# Patient Record
Sex: Male | Born: 1935 | Race: White | Hispanic: No | Marital: Married
Health system: Southern US, Community
[De-identification: ages and names within clinical notes are randomized; demographics above are authoritative.]

---

## 2004-02-10 ENCOUNTER — Other Ambulatory Visit: Payer: Self-pay

## 2004-02-29 ENCOUNTER — Other Ambulatory Visit: Payer: Self-pay

## 2006-01-16 ENCOUNTER — Other Ambulatory Visit: Payer: Self-pay

## 2006-01-17 ENCOUNTER — Ambulatory Visit: Payer: Self-pay | Admitting: Ophthalmology

## 2006-02-21 ENCOUNTER — Ambulatory Visit: Payer: Self-pay | Admitting: Ophthalmology

## 2006-02-22 ENCOUNTER — Other Ambulatory Visit: Payer: Self-pay

## 2006-02-22 ENCOUNTER — Inpatient Hospital Stay: Payer: Self-pay | Admitting: Internal Medicine

## 2006-07-31 ENCOUNTER — Ambulatory Visit: Payer: Self-pay | Admitting: Family Medicine

## 2007-05-17 ENCOUNTER — Ambulatory Visit: Payer: Self-pay | Admitting: Internal Medicine

## 2007-05-22 ENCOUNTER — Other Ambulatory Visit: Payer: Self-pay

## 2007-05-22 ENCOUNTER — Inpatient Hospital Stay: Payer: Self-pay | Admitting: Internal Medicine

## 2007-05-23 ENCOUNTER — Other Ambulatory Visit: Payer: Self-pay

## 2007-10-11 ENCOUNTER — Inpatient Hospital Stay: Payer: Self-pay | Admitting: Cardiovascular Disease

## 2007-10-11 ENCOUNTER — Other Ambulatory Visit: Payer: Self-pay

## 2008-08-07 ENCOUNTER — Ambulatory Visit: Payer: Self-pay | Admitting: Family Medicine

## 2009-10-19 ENCOUNTER — Emergency Department: Payer: Self-pay | Admitting: Internal Medicine

## 2010-08-02 ENCOUNTER — Emergency Department: Payer: Self-pay | Admitting: Emergency Medicine

## 2010-10-14 ENCOUNTER — Observation Stay: Payer: Self-pay | Admitting: Internal Medicine

## 2011-03-02 ENCOUNTER — Emergency Department: Payer: Self-pay | Admitting: Emergency Medicine

## 2011-08-01 ENCOUNTER — Ambulatory Visit: Payer: Self-pay | Admitting: Gastroenterology

## 2012-05-25 ENCOUNTER — Emergency Department: Payer: Self-pay | Admitting: *Deleted

## 2012-05-25 LAB — CBC WITH DIFFERENTIAL/PLATELET
Basophil %: 1.3 %
Eosinophil #: 0.2 10*3/uL (ref 0.0–0.7)
Eosinophil %: 3.6 %
HCT: 27.1 % — ABNORMAL LOW (ref 40.0–52.0)
HGB: 8.8 g/dL — ABNORMAL LOW (ref 13.0–18.0)
Lymphocyte %: 27.6 %
MCH: 24.1 pg — ABNORMAL LOW (ref 26.0–34.0)
MCV: 74 fL — ABNORMAL LOW (ref 80–100)
Monocyte #: 0.5 x10 3/mm (ref 0.2–1.0)
Neutrophil #: 3.6 10*3/uL (ref 1.4–6.5)
Neutrophil %: 59.4 %
Platelet: 358 10*3/uL (ref 150–440)
RDW: 15 % — ABNORMAL HIGH (ref 11.5–14.5)

## 2012-05-25 LAB — BASIC METABOLIC PANEL
Anion Gap: 8 (ref 7–16)
BUN: 13 mg/dL (ref 7–18)
Chloride: 107 mmol/L (ref 98–107)
Co2: 26 mmol/L (ref 21–32)
EGFR (African American): 40 — ABNORMAL LOW
EGFR (Non-African Amer.): 34 — ABNORMAL LOW
Glucose: 102 mg/dL — ABNORMAL HIGH (ref 65–99)
Osmolality: 282 (ref 275–301)
Potassium: 4.2 mmol/L (ref 3.5–5.1)
Sodium: 141 mmol/L (ref 136–145)

## 2012-06-10 ENCOUNTER — Ambulatory Visit: Payer: Self-pay | Admitting: Gastroenterology

## 2012-10-03 ENCOUNTER — Emergency Department: Payer: Self-pay | Admitting: Emergency Medicine

## 2012-10-03 LAB — CBC
HCT: 28.7 % — ABNORMAL LOW (ref 40.0–52.0)
MCH: 20.4 pg — ABNORMAL LOW (ref 26.0–34.0)
MCHC: 31.2 g/dL — ABNORMAL LOW (ref 32.0–36.0)
MCV: 65 fL — ABNORMAL LOW (ref 80–100)
Platelet: 413 10*3/uL (ref 150–440)
RDW: 16.6 % — ABNORMAL HIGH (ref 11.5–14.5)
WBC: 8.4 10*3/uL (ref 3.8–10.6)

## 2012-10-03 LAB — URINALYSIS, COMPLETE
Blood: NEGATIVE
Glucose,UR: NEGATIVE mg/dL (ref 0–75)
Hyaline Cast: 6
Nitrite: NEGATIVE
Ph: 5 (ref 4.5–8.0)
Protein: NEGATIVE
RBC,UR: 6 /HPF (ref 0–5)
WBC UR: 10 /HPF (ref 0–5)

## 2012-10-03 LAB — COMPREHENSIVE METABOLIC PANEL
Albumin: 4 g/dL (ref 3.4–5.0)
Alkaline Phosphatase: 137 U/L — ABNORMAL HIGH (ref 50–136)
Calcium, Total: 9.2 mg/dL (ref 8.5–10.1)
Co2: 26 mmol/L (ref 21–32)
EGFR (Non-African Amer.): 33 — ABNORMAL LOW
Glucose: 110 mg/dL — ABNORMAL HIGH (ref 65–99)
SGOT(AST): 25 U/L (ref 15–37)
SGPT (ALT): 22 U/L (ref 12–78)
Sodium: 145 mmol/L (ref 136–145)

## 2012-10-03 LAB — PROTIME-INR
INR: 0.9
Prothrombin Time: 12.8 secs (ref 11.5–14.7)

## 2012-10-03 LAB — TROPONIN I: Troponin-I: <0.02 ng/mL

## 2013-08-04 ENCOUNTER — Ambulatory Visit: Payer: Self-pay | Admitting: Internal Medicine

## 2013-08-13 ENCOUNTER — Inpatient Hospital Stay: Payer: Self-pay | Admitting: Family Medicine

## 2013-08-13 LAB — RETICULOCYTES
Absolute Retic Count: 0.0589 10*6/uL (ref 0.019–0.186)
Reticulocyte: 1.7 % (ref 0.4–3.1)

## 2013-08-13 LAB — URINALYSIS, COMPLETE
Bacteria: NONE SEEN
Blood: NEGATIVE
Glucose,UR: NEGATIVE mg/dL (ref 0–75)
Nitrite: NEGATIVE
RBC,UR: <1 /HPF (ref 0–5)
Specific Gravity: 1.012 (ref 1.003–1.030)
Squamous Epithelial: 1
WBC UR: 4 /HPF (ref 0–5)

## 2013-08-13 LAB — CBC WITH DIFFERENTIAL/PLATELET
Basophil %: 0.9 %
Eosinophil %: 3.3 %
HCT: 20.7 % — ABNORMAL LOW (ref 40.0–52.0)
Lymphocyte #: 1.4 10*3/uL (ref 1.0–3.6)
Lymphocyte %: 21 %
MCH: 18.1 pg — ABNORMAL LOW (ref 26.0–34.0)
MCHC: 30.2 g/dL — ABNORMAL LOW (ref 32.0–36.0)
Monocyte #: 0.6 x10 3/mm (ref 0.2–1.0)
Monocyte %: 8.9 %
Neutrophil #: 4.4 10*3/uL (ref 1.4–6.5)
Neutrophil %: 65.9 %
RBC: 3.46 10*6/uL — ABNORMAL LOW (ref 4.40–5.90)
RDW: 17.6 % — ABNORMAL HIGH (ref 11.5–14.5)

## 2013-08-13 LAB — COMPREHENSIVE METABOLIC PANEL
Albumin: 3.7 g/dL (ref 3.4–5.0)
Alkaline Phosphatase: 120 U/L (ref 50–136)
Anion Gap: 7 (ref 7–16)
BUN: 24 mg/dL — ABNORMAL HIGH (ref 7–18)
Chloride: 108 mmol/L — ABNORMAL HIGH (ref 98–107)
Co2: 23 mmol/L (ref 21–32)
Creatinine: 2.35 mg/dL — ABNORMAL HIGH (ref 0.60–1.30)
Glucose: 109 mg/dL — ABNORMAL HIGH (ref 65–99)
Potassium: 4.5 mmol/L (ref 3.5–5.1)
SGOT(AST): 31 U/L (ref 15–37)
SGPT (ALT): 29 U/L (ref 12–78)
Total Protein: 6.8 g/dL (ref 6.4–8.2)

## 2013-08-13 LAB — PROTIME-INR: INR: 1.1

## 2013-08-13 LAB — IRON AND TIBC
Iron Bind.Cap.(Total): 447 ug/dL (ref 250–450)
Iron: 15 ug/dL — ABNORMAL LOW (ref 65–175)
Unbound Iron-Bind.Cap.: 432 ug/dL

## 2013-08-13 LAB — TROPONIN I: Troponin-I: 0.02 ng/mL

## 2013-08-13 LAB — FERRITIN: Ferritin (ARMC): 4 ng/mL — ABNORMAL LOW (ref 8–388)

## 2013-08-14 LAB — CBC WITH DIFFERENTIAL/PLATELET
Basophil #: 0.1 10*3/uL (ref 0.0–0.1)
Eosinophil %: 1.1 %
HCT: 26.3 % — ABNORMAL LOW (ref 40.0–52.0)
HGB: 8.2 g/dL — ABNORMAL LOW (ref 13.0–18.0)
Lymphocyte #: 1.7 10*3/uL (ref 1.0–3.6)
Monocyte %: 8.5 %
Neutrophil #: 8.4 10*3/uL — ABNORMAL HIGH (ref 1.4–6.5)
Platelet: 356 10*3/uL (ref 150–440)
RBC: 4.09 10*6/uL — ABNORMAL LOW (ref 4.40–5.90)
RDW: 23 % — ABNORMAL HIGH (ref 11.5–14.5)
WBC: 11.3 10*3/uL — ABNORMAL HIGH (ref 3.8–10.6)

## 2013-08-14 LAB — MAGNESIUM: Magnesium: 2.1 mg/dL

## 2013-08-14 LAB — BASIC METABOLIC PANEL
Anion Gap: 9 (ref 7–16)
BUN: 19 mg/dL — ABNORMAL HIGH (ref 7–18)
Calcium, Total: 9 mg/dL (ref 8.5–10.1)
Co2: 22 mmol/L (ref 21–32)
Creatinine: 1.9 mg/dL — ABNORMAL HIGH (ref 0.60–1.30)
EGFR (Non-African Amer.): 33 — ABNORMAL LOW
Osmolality: 282 (ref 275–301)

## 2013-08-14 LAB — HEMOGLOBIN: HGB: 8.1 g/dL — ABNORMAL LOW (ref 13.0–18.0)

## 2013-08-15 LAB — CBC WITH DIFFERENTIAL/PLATELET
Basophil #: 0.1 10*3/uL (ref 0.0–0.1)
Basophil %: 0.9 %
Eosinophil #: 0.2 10*3/uL (ref 0.0–0.7)
Eosinophil %: 2 %
HCT: 25.2 % — ABNORMAL LOW (ref 40.0–52.0)
Lymphocyte #: 1.1 10*3/uL (ref 1.0–3.6)
Lymphocyte %: 12.2 %
MCH: 20.3 pg — ABNORMAL LOW (ref 26.0–34.0)
MCV: 63 fL — ABNORMAL LOW (ref 80–100)
Monocyte #: 0.7 x10 3/mm (ref 0.2–1.0)
Monocyte %: 7.5 %
Neutrophil %: 77.4 %
RDW: 22.8 % — ABNORMAL HIGH (ref 11.5–14.5)

## 2013-08-15 LAB — OCCULT BLOOD X 1 CARD TO LAB, STOOL: Occult Blood, Feces: NEGATIVE

## 2013-08-15 LAB — BASIC METABOLIC PANEL
Anion Gap: 7 (ref 7–16)
BUN: 15 mg/dL (ref 7–18)
Calcium, Total: 8.8 mg/dL (ref 8.5–10.1)
Co2: 22 mmol/L (ref 21–32)
Creatinine: 1.43 mg/dL — ABNORMAL HIGH (ref 0.60–1.30)
EGFR (Non-African Amer.): 46 — ABNORMAL LOW
Glucose: 93 mg/dL (ref 65–99)
Osmolality: 286 (ref 275–301)
Sodium: 143 mmol/L (ref 136–145)

## 2013-08-15 LAB — CLOSTRIDIUM DIFFICILE BY PCR

## 2013-08-16 LAB — CBC WITH DIFFERENTIAL/PLATELET
Basophil #: 0.1 10*3/uL (ref 0.0–0.1)
Eosinophil #: 0.5 10*3/uL (ref 0.0–0.7)
HCT: 25.8 % — ABNORMAL LOW (ref 40.0–52.0)
Lymphocyte #: 1.3 10*3/uL (ref 1.0–3.6)
MCHC: 31.3 g/dL — ABNORMAL LOW (ref 32.0–36.0)
MCV: 65 fL — ABNORMAL LOW (ref 80–100)
Monocyte #: 0.7 x10 3/mm (ref 0.2–1.0)
Monocyte %: 8.7 %
Neutrophil #: 5 10*3/uL (ref 1.4–6.5)
Neutrophil %: 66.3 %
Platelet: 351 10*3/uL (ref 150–440)
RDW: 23 % — ABNORMAL HIGH (ref 11.5–14.5)

## 2013-08-16 LAB — BASIC METABOLIC PANEL
EGFR (Non-African Amer.): 42 — ABNORMAL LOW
Glucose: 88 mg/dL (ref 65–99)
Sodium: 145 mmol/L (ref 136–145)

## 2013-08-16 LAB — WBCS, STOOL

## 2013-08-19 LAB — CBC WITH DIFFERENTIAL/PLATELET
Basophil #: 0.1 x10 3/mm 3
Basophil %: 1.5 %
Eosinophil #: 0.3 x10 3/mm 3
Eosinophil %: 6.5 %
HCT: 26.1 % — ABNORMAL LOW
HGB: 8.2 g/dL — ABNORMAL LOW
Lymphocyte %: 25.7 %
Lymphs Abs: 1.3 x10 3/mm 3
MCH: 20.2 pg — ABNORMAL LOW
MCHC: 31.6 g/dL — ABNORMAL LOW
MCV: 64 fL — ABNORMAL LOW
Monocyte #: 0.5 "x10 3/mm "
Monocyte %: 9.7 %
Neutrophil #: 2.8 x10 3/mm 3
Neutrophil %: 56.6 %
Platelet: 293 x10 3/mm 3
RBC: 4.08 x10 6/mm 3 — ABNORMAL LOW
RDW: 25.4 % — ABNORMAL HIGH
WBC: 4.9 x10 3/mm 3

## 2013-08-19 LAB — BASIC METABOLIC PANEL WITH GFR
Anion Gap: 7
BUN: 11 mg/dL
Calcium, Total: 8.7 mg/dL
Chloride: 114 mmol/L — ABNORMAL HIGH
Co2: 25 mmol/L
Creatinine: 1.31 mg/dL — ABNORMAL HIGH
EGFR (African American): 60 — ABNORMAL LOW
EGFR (Non-African Amer.): 51 — ABNORMAL LOW
Glucose: 89 mg/dL
Osmolality: 289
Potassium: 3.6 mmol/L
Sodium: 146 mmol/L — ABNORMAL HIGH

## 2013-09-03 ENCOUNTER — Ambulatory Visit: Payer: Self-pay | Admitting: Nurse Practitioner

## 2014-08-01 IMAGING — RF DG BARIUM SWALLOW
5 series · 14 of 20 positions shown · non-contrast
Comparison: none

REASON FOR EXAM: dysphagia
COMMENTS:

[Series 1: fluoro_barium 2fps_bw · 0.17mm/px · 3 of 28 frames shown (1 of 5)]
[frame 5/28]
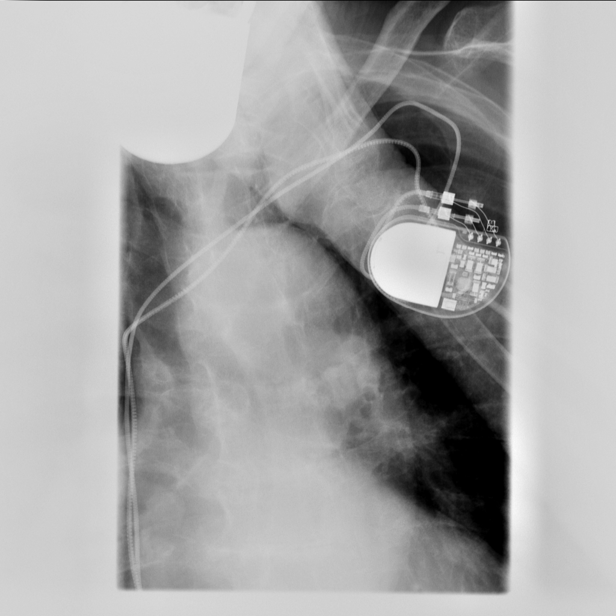
[frame 23/28]
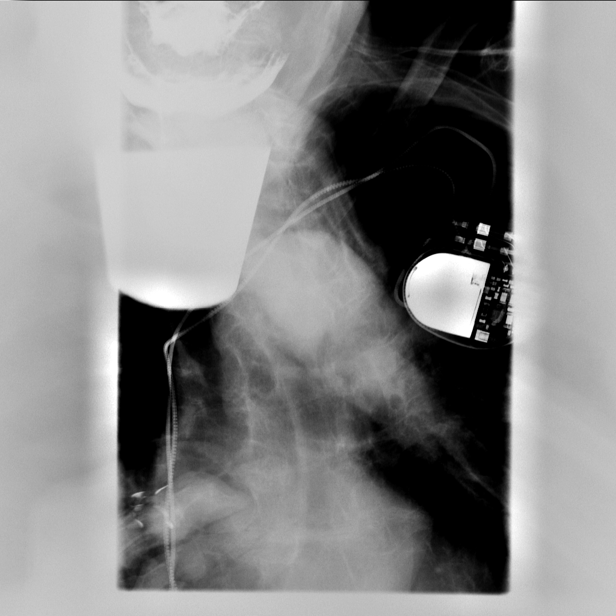
[frame 24/28]
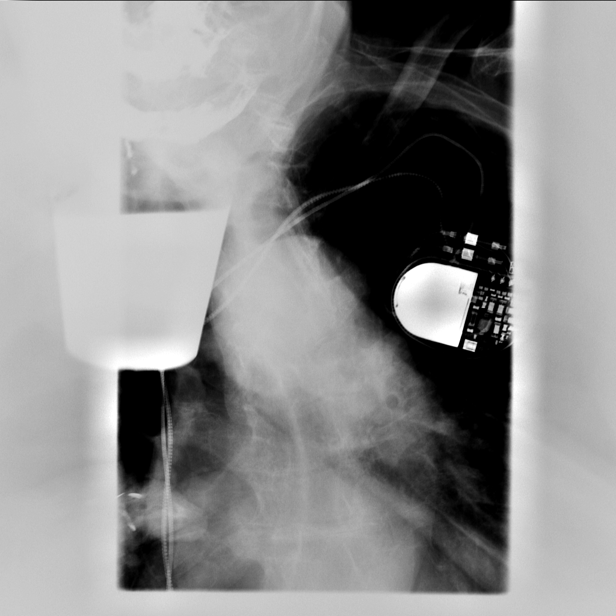

[Series 2: fluoro_barium 2fps_bw · 0.17mm/px · 3 of 25 frames shown (2 of 5)]
[frame 13/25]
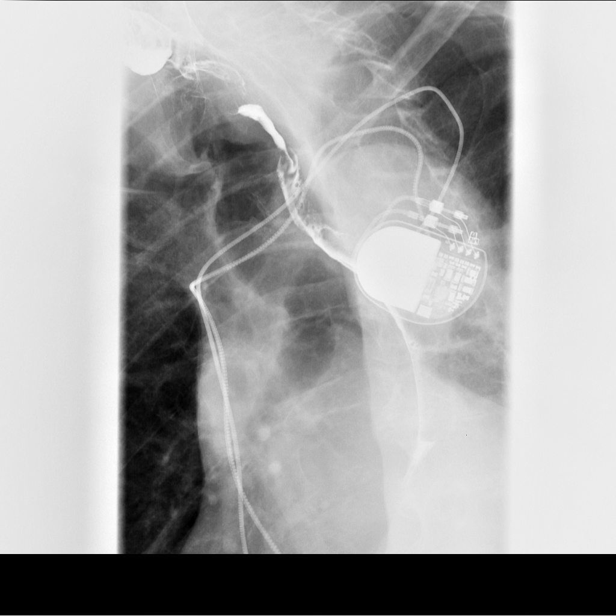
[frame 22/25]
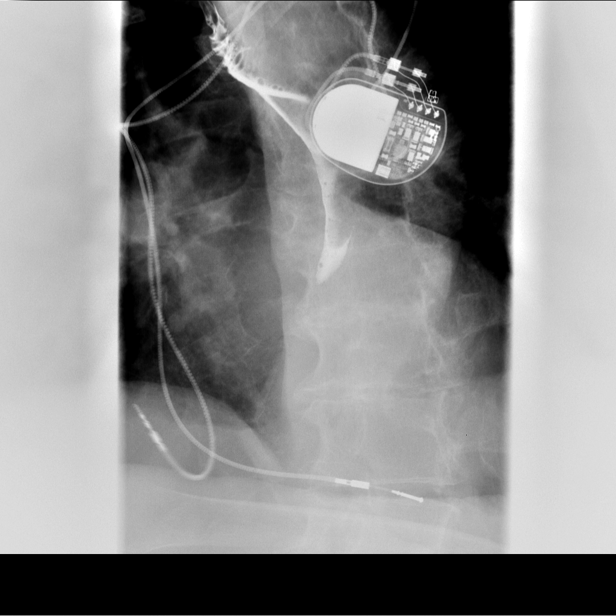
[frame 25/25]
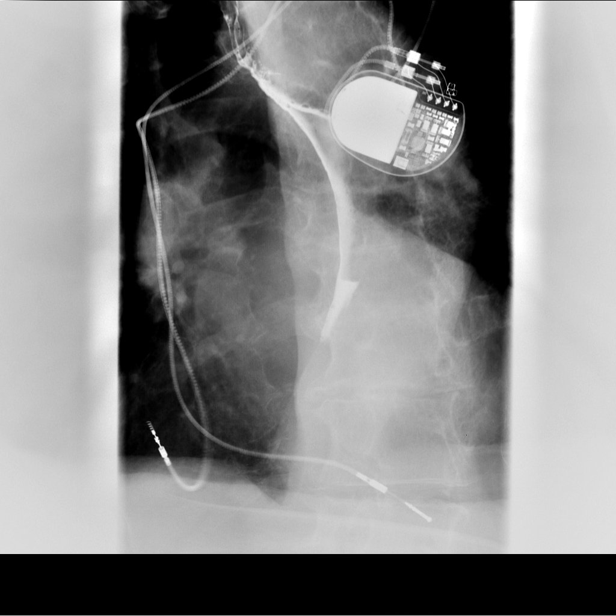

[Series 3: fluoro_barium 2fps_bw · 0.17mm/px · 2 of 30 frames shown (3 of 5)]
[frame 14/30]
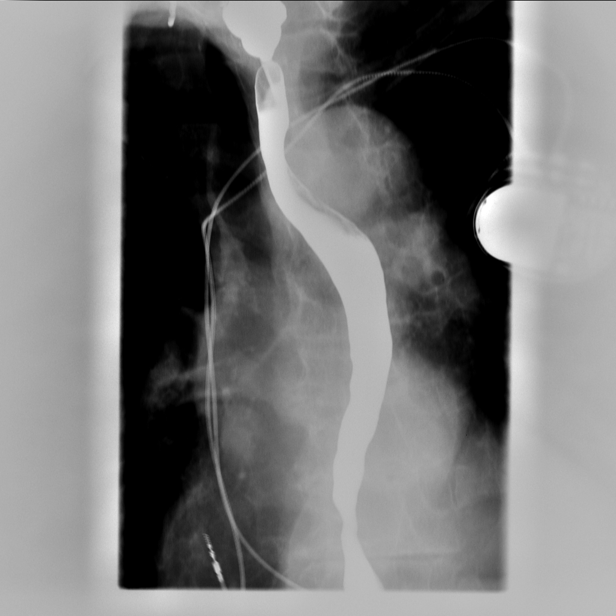
[frame 16/30]
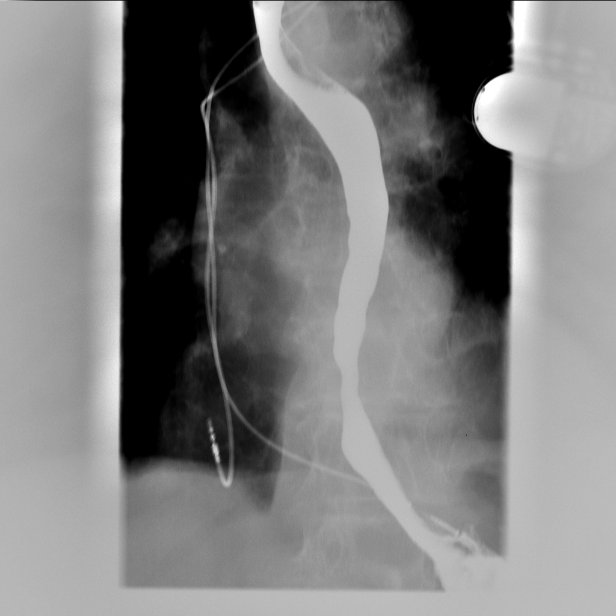

[Series 4: fluoro_barium 2fps_bw · 0.17mm/px · 3 of 33 frames shown (4 of 5)]
[frame 5/33]
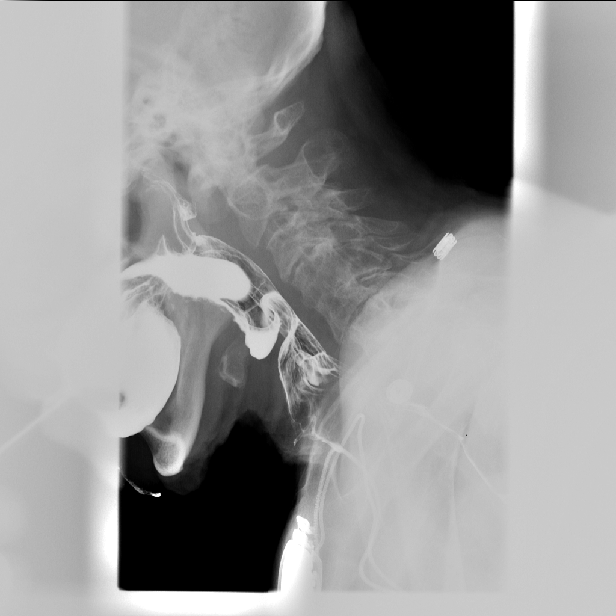
[frame 17/33]
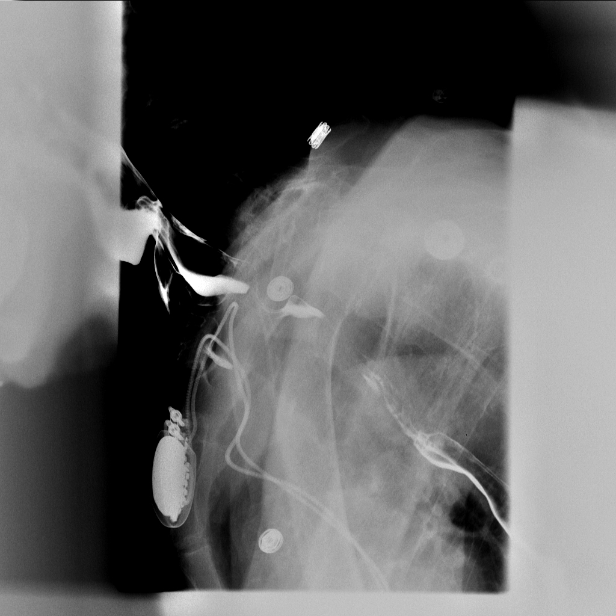
[frame 29/33]
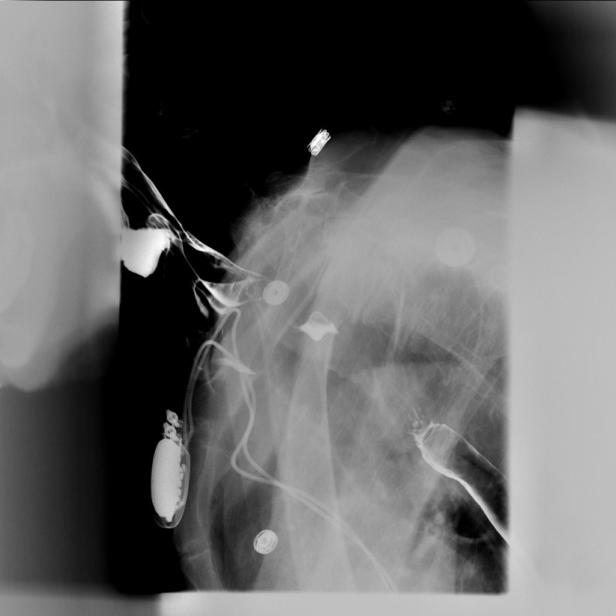

[Series 5: fluoro_barium 2fps_bw · 0.17mm/px · 3 of 5 frames shown (5 of 5)]
[frame 1/5]
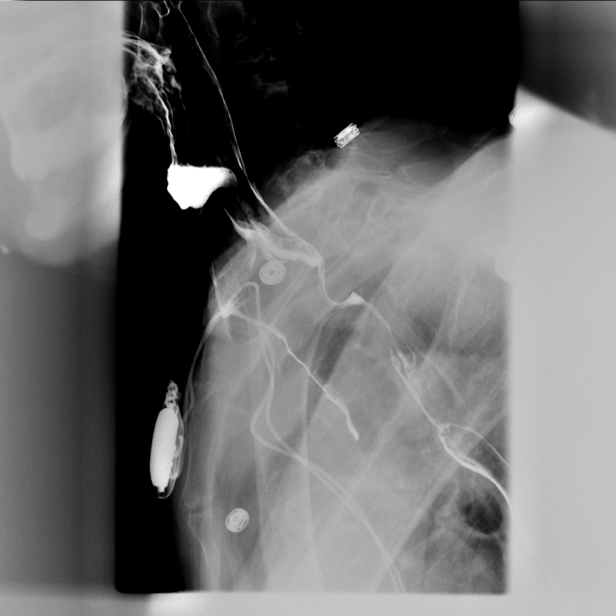
[frame 2/5]
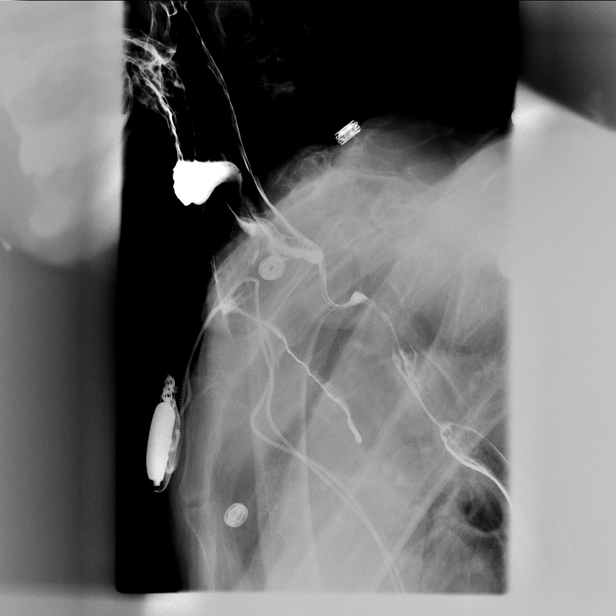
[frame 5/5]
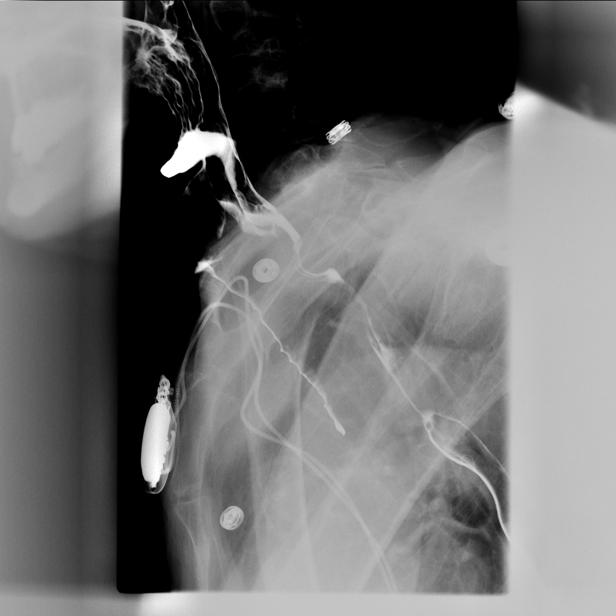

[14 of 20 positions shown; findings below may reference images not displayed]

PROCEDURE:     FL  - FL BARIUM SWALLOW  - August 18, 2013  [DATE]

RESULT:     Limited single contrast barium swallow exam was attempted. The
patient had difficulty standing and drinking. The initial swallow with
small. A subsequent swallow demonstrated are a greater volume with some
persistent narrowing in the upper esophagus. A lateral swallow was
attempted. The patient aspirated freely but didn't have a spontaneous cough.
There is retention of barium in the vallecula. There some mild narrowing
anterior to posterior in the upper esophagus on the lateral view.
IMPRESSION: 1. Incomplete limited study based on spontaneous aspiration. Modified
swallow exam is recommended.
2. There appears to be some persistent narrowing in the upper esophagus on
the oblique frontal view with mild narrowing anterior to posterior on the
lateral view. Given the incomplete nature the study this is of uncertain
significance. Endoscopic evaluation could be considered.

[REDACTED]

## 2014-10-04 DEATH — deceased

## 2015-03-26 NOTE — Consult Note (Signed)
PATIENT NAME:  Derrick Salazar, Derrick Salazar MR#:  [id number] DATE OF BIRTH:  January 03, 1931  DATE OF CONSULTATION:  08/14/2013  CONSULTING PHYSICIAN:  Ezzard StandingPaul Y. Antony Sian, MD  REASON FOR REFERRAL: Iron deficiency anemia.   HISTORY OF PRESENT ILLNESS: The patient is a 81 year old white male who was brought in from his primary doctor's office due to hemoglobin being so low. He has a known history of iron deficiency anemia. In fact I did a colonoscopy and upper endoscopy in August 2012 for the same reason. At that time, upper endoscopy and colonoscopy were completely normal. I had recommended that he have a video capsule study done.   The patient did follow up in our office in 2013 and capsule was scheduled; however, that was canceled afterwards and the patient never followed up until he was brought here.   In the Emergency Room, his stool was negative for blood. His hemoglobin was 6.2 on admission. He was given some blood transfusions. This morning it is 8.2. However, I was not able to get any history from the patient. He was pretty sleepy after the nurses gave him a bath. He would not wake up and would not provide any history at all to me.   REVIEW OF SYSTEMS: Can not be obtained.   ALLERGIES: No known drug allergy.   PAST MEDICAL HISTORY: Apparently he has a history of hypertension and coronary artery disease and had a pacemaker placed and history of reflux.   MEDICATIONS AT HOME: Include omeprazole and Lasix.   FAMILY HISTORY: Hypertension, heart disease.   PHYSICAL EXAMINATION:  GENERAL: Again, the patient is in no acute distress, afebrile. Again, the patient is unable to provide any history.  VITAL SIGNS: Stable.  CARDIAC: Regular rate and rhythm without murmurs.  LUNGS: Clear bilaterally.  ABDOMEN: Normoactive bowel sounds. Soft, nontender. There is no hepatomegaly.  EXTREMITIES: No clubbing, cyanosis, or edema.   LABORATORY, DIAGNOSTIC, AND RADIOLOGICAL DATA: Today after blood transfusion, it is 8.2 but  the creatinine is 1.9. The rest of the liver enzymes are normal. White count 11.3.   ASSESSMENT AND PLAN: This is a patient with a known history of iron deficiency anemia. His ferritin level was only 4 but there are no signs of active bleeding.   He does have a history of iron deficiency anemia; however, it is unclear whether he is having internal gastrointestinal bleeding or not. The patient is not alert enough to undergo any procedures at this time. Even if he were to repeat it, I not sure whether we would find anything abnormal either on endoscopy or colonoscopy.   I will not plan on doing anything unless there is power of attorney or a family member who insists that further gastrointestinal work-up be done.   The other alternative is to consider doing a video capsule as an outpatient.   Thank you for your referral.     ____________________________ Ezzard StandingPaul Y. Bluford Kaufmannh, MD pyo:np D: 08/14/2013 15:16:28 ET T: 08/14/2013 15:49:07 ET JOB#: [id number]  cc: Ezzard StandingPaul Y. Bluford Kaufmannh, MD, <Dictator> Wallace CullensPAUL Y Ewald Beg MD ELECTRONICALLY SIGNED 08/15/2013 8:43

## 2015-03-26 NOTE — Consult Note (Signed)
Pt seen and examined. Full consult to follow. Pt with dementia. Just had a bath. Sleeping and not waking up to talk to me. Hx of Fe def anemia. Had normal EGD/colon by me in 07/2011. Recommended to have video capsule study then, but patient cancelled and never followed up. Stool neg for blood. Ferritin level quite low. Got blood transfusion. Not sure if we were repeat to EGD/colon, whether we will find anything abnormal. Anyway, patient cannot give consent to me now. Must have family or POA insist that we proceed before I will schedule endoscopy for patient. Thanks  Electronic Signatures: Lutricia Feilh, Latandra Loureiro (MD)  (Signed on 11-Sep-14 08:34)  Authored  Last Updated: 11-Sep-14 08:34 by Lutricia Feilh, Cory Rama (MD)

## 2015-03-26 NOTE — Consult Note (Signed)
Brief Consult Note: Diagnosis: Mild cognitive disorder, delirium secondary to medical condition/medications resolved.   Patient was seen by consultant.   Consult note dictated.   Recommend further assessment or treatment.   Discussed with Attending MD.   Comments: [name] has no psychiatric past. he was evaluated by Dr. Guss Bundehalla for capacity to decide about disposition. He was somewhat confused then. I evaluated the patient last night. He has good understanding of his situation and can discuss pros and cons of returning to home. Case discussed with care manager.  PLAN: 1. The patient does have the capacity to decide about discharge.  2. No medications recommended..  Electronic Signatures: Kristine LineaPucilowska, Derrick Salazar (MD)  (Signed 16-Sep-14 22:57)  Authored: Brief Consult Note   Last Updated: 16-Sep-14 22:57 by Kristine LineaPucilowska, Dawnisha Marquina (MD)

## 2015-03-26 NOTE — Consult Note (Signed)
PATIENT NAME:  Derrick Salazar, [name] [id number] OF BIRTH:  12-04-1931 OF ADMISSION:  09/10/2014OF CONSULTATION:  08/18/2013 PHYSICIAN: Krystal EatonShayiq Ahmadzia, MDPHYSICIAN: Kristine LineaJolanta Zarin Knupp, MDCARE PHYSICIAN: Steele SizerMark A. Crissman, MD FOR CONSULTATION: Evaluation of capacity to decide about disposition.  DATA: [name] is a 81 year old male with no psychiatric past.  COMPLAINT: "You need to ask Dr. Dossie Arbourrissman."   OF PRESENT ILLNESS: [name] has no history of mental illness except for mild cognitive decline as documented by Dr. Guss Bundehalla in her consultation oh 08/16/2013. The patient had less than ideal recall and was slightly confused. At the time of my assessment the patient was alert and fully oriented, easy to interview, thoughtful and funny. He denies any symptoms of depression, anxiety or psychosis. There is no alcohol, illicit substance or prescription pill abuse. The patient realizes that his memory, energy and stamina are not the same as they used to be but he enjoys the solitude of his house. He has an aid who visits him twice a week and a friend who also helps but is out of town now. He would like to go home with home health. After blood transfusion, he feels much better, ready for discharge. He is not interested in discharge to SNF and considers it a waste of money. He is philosophical about his age, realizes that he old and will have to "go". He feels fully prepared to meet his maker but is in no rush. PSYCHIATRIC HISTORY: None. PSYCHIATRIC HISTORY: None.  MEDICAL HISTORY: Hypertension, hyperlipidemia, CAD, chronic A. fib, history of heart block, status post pacemaker, history of colon polyps, osteoarthritis and GERD.   ALLERGIES: No known drug allergies.  ON ADMISSION: Omeprazole 20 mg daily, tamsulosin 0.4 mg daily and furosemide 20 mg daily.   HISTORY: As above lives by himself, no family.  OF SYSTEMS: The patient denies any pain or discomfort.   EXAMINATION:VITAL SIGNS: Blood pressure 140/67, pulse 68,  respirations 18, temperature 98.4. This is an elderly thin gentleman in no acute distress. The rest of the physical examination is deferred to his primary attending. DATA: Chemistries are within normal limits except Cr 1.56. CBC indicates severe anemia with Hg 8.1 after blood transfusion.  STATUS EXAMINATION: The patient is alert and oriented to person, place, and situation. He is pleasant, polite and cooperative. He is seating on the edge of his bed chatting with a nurse.  He is pleasant, polite and cooperative. She is wearing a hospital gown. He maintains good eye contact. His speech is soft. He almost whispers. Mood is "good" with full affect. Thought process is logical. There are no safety issues or psychosis. His cognition is grossly intact. His insight and judgment are good.   I:  Cognitive disorder NOS.    Rule out delirium secondary to medical condition now resolved. II:  Deferred. III:  Hypertension, dyslipidemia, CAD, chronic A. fib, history of heart block, status post pacemaker, history of colon polyps, osteoarthritis, GERD.  AXIS IV:  New onset physical illness, primary support, cognitive decline, loss of way of life.  V:  Global Assessment of Functioning 50.   1. The patient does have the capacity to decide about discharge. 2. No medications recommended but we could consider Remeron if sleep and lack of appetite are of concern.    Electronic Signatures: Kristine LineaPucilowska, Byrl Latin (MD)  (Signed on 16-Sep-14 22:59)  Authored  Last Updated: 16-Sep-14 22:59 by Kristine LineaPucilowska, Zia Najera (MD)

## 2015-03-26 NOTE — Consult Note (Signed)
PATIENT NAME:  Derrick Salazar, Derrick Salazar MR#:  [id number] DATE OF BIRTH:  02/12/1931  DATE OF CONSULTATION:  08/16/2013  [id number]  REFERRING PHYSICIAN:   CONSULTING PHYSICIAN:  Rosalynn Sergent K. Court Gracia, MD  AGE:  81 years.  SEX:  Male.  RACE:  White.  The patient was seen in consultation in room# 225.   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 The patient widowed for 14 years after being married for many years, has been living by himself, and he has a Home Health care that helps him. The patient was brought to the hospital and was asked to be seen for decision-making capacity.   OBJECTIVE:  The patient was being seen lying in bed and is being cared by 2 CNAs who get him dressed. He is alert and he said he was in a hospital in [place] but does not know the name of the hospital. He is hard of hearing and so questions had to be repeated. He denies feeling depressed. He could count money. He said 4 quarters, 10 dimes, but he did not know the number of nickels and a hundred pennies in a dollar. Denies hearing voice saying things, denies feeling hopeless or helpless. He reports that he likes to live by himself and he did not want to go to facility and when mentioned immediately he said "no."  Regarding judgment for fire, he said he will call the people and run away and clap his hands. Memory and recall are poor. He could not remember any of the 3 objects that he was asked to remember after a few minutes and several minutes and he got frustrated that he could not recall any of the 3 objects. Insight and judgment are guarded.  IMPRESSION:  Dementia, mild, versus a mild cognitive impairment with loss of recall and poor decision making capacity.   IMPRESSION:  The patient has dementia, which is late onset, and with memory and recall that are poor and could not recall any of the 3 objects that he was asked to remember after a few minutes and several minutes which places him in difficulty in  taking decisions and caring for himself. He does need help and supervision in taking care of himself and he may need a power of attorney or a guardian as recommended by the court system.  ____________________________ Jannet MantisSurya K. Guss Bundehalla, MD skc:jm D: 08/16/2013 16:52:00 ET T: 08/16/2013 17:02:56 ET JOB#: [id number]  cc: Monika SalkSurya K. Guss Bundehalla, MD, <Dictator> Beau FannySURYA K Khristy Kalan MD ELECTRONICALLY SIGNED 08/17/2013 15:50

## 2015-03-26 NOTE — Consult Note (Signed)
   Comments   I spoke with pt's friend/primary contact, Derrick Salazar. Mr Derrick Salazar confirms that pt has no living relatives. Mr Derrick Salazar has helped care for pt for the past several years. Pt lives alone but has a CNA two days a week and an Charity fundraiserN who checks on him as well, both through [employer]. Mr Derrick Salazar does not think that pt has dementia. told Mr Derrick Salazar of my conversation with pt yesterday about code status. He says that he has had discussions about end of life with pt and  feels that pt would want to be a DNR. As I felt pt was lucid yesterday when I spoke with him, I will change pt to DNR.  my conversation with Mr Derrick Salazar, I spoke with pt's CNA Derrick Salazar(Shirley # [phone]) who was at pt's bedside. She says that pt stays in bed alot but that he always knows her and does not feel that pt has dementia.  change in mental status is delerium though unclear etiology. Agree with prn haldol as ordered by Dr Nemiah CommanderKalisetti.  #'s for Derrick BackboneCasey Salazar: [phone] or [phone]  Electronic Signatures: Derrick Salazar, Derrick SineNancy (MD)  (Signed 11-Sep-14 14:55)  Authored: Palliative Care   Last Updated: 11-Sep-14 14:55 by Tryone Kille, Derrick SineNancy (MD)

## 2015-03-26 NOTE — Consult Note (Signed)
Chief Complaint:  Subjective/Chief Complaint history of dysphagia   Assessment/Plan:  Assessment/Plan:  Plan Discussed case with Dr Bluford Kaufmannh and Dr Shelle Ironein. In a 81 year old with multiple comorbidities, who is not currently complaining of dysphagia, and EGD would not be recommended at this time. He has a history of sore throat complaints with dysphagia, but he states he has not had any trouble swallowing for several days now. No trouble eating, no trouble with his appetite. Bariu mswallow reviewed and non-diagnostic due to imcomplete exam. He is not complaining of dysphagia currently, and therefore we will hold off on endoscopic intervention at this time.   Electronic Signatures: Ashok CordiaEarle, Gissell Barra M (PA-C)  (Signed 17-Sep-14 08:54)  Authored: Chief Complaint, Assessment/Plan   Last Updated: 17-Sep-14 08:54 by Ashok CordiaEarle, Tiffany Calmes M (PA-C)

## 2015-03-26 NOTE — Consult Note (Signed)
Chief Complaint:  Subjective/Chief Complaint abrnomal barium swallow, hx of dysphagia  HPI: Patient is sitting up in chair at bedside. He states his back hurts due to positioning, and he is feeling "weak". When asked about eating habits, he states he has been eating without difficulty, but admits he has not been eating as much as he usually does. He denies dysphagia, denies trouble swallowing or difficulty eating. Barium swallow reviewed, non-diagnostic. The patient states he "used to" have trouble swallowing, but that has resolved and he has not had any trouble "for several days". Was recently treated with antifungals and there is question whether or not that was what helped improve his symptoms. No other current complaints.   Brief Assessment:  GEN well developed, well nourished, no acute distress, thin, sitting in chair at bedside   Cardiac Irregular   Respiratory normal resp effort  clear BS   Gastrointestinal Normal   Gastrointestinal details normal Soft  Nontender  Nondistended  No masses palpable  No rebound tenderness  No gaurding  No rigidity   Additional Physical Exam Patient is alert and speaking to me, appears to be sharp enough to answer my questions.   Radiology Results: XRay:    15-Sep-14 08:39, Barium Swallow Diagnostic  Barium Swallow Diagnostic   REASON FOR EXAM:    dysphagia  COMMENTS:       PROCEDURE: FL  - FL BARIUM SWALLOW  - Aug 18 2013  8:39AM     RESULT: Limited single contrast barium swallow exam was attempted. The   patient had difficulty standing and drinking. The initial swallow with   small. A subsequent swallow demonstrated are a greater volume with some   persistent narrowing in the upper esophagus. A lateral swallow was   attempted. The patient aspirated freely but didn't have a spontaneous   cough. There is retention of barium in the vallecula. There some mild   narrowing anterior to posterior in the upper esophagus on the lateral    view.    IMPRESSION:   1. Incomplete limited study based on spontaneous aspiration. Modified   swallow exam is recommended.  2. There appears to be some persistent narrowing in the upper esophagus   on the oblique frontal view with mild narrowing anterior to posterior on   the lateral view. Given the incomplete nature the study this is of   uncertain significance. Endoscopic evaluation could be considered.    Dictation Site: 1        Verified By: Elveria RoyalsGEOFFREY H. BROWNE, M.D., MD   Assessment/Plan:  Assessment/Plan:  Assessment dysphagia- history of. patient is denying dysphagia/trouble swallowing when he was asked. He says he "used to" have trouble, but has not had any difficulty recently, including the past several days.  Abnormal BS- non-diagnostic. possible esophageal stricture, but unclear.   Plan Reviewed case with Dr Bluford Kaufmannh and Owens Sharkawn Harrison, who consulted the patient last week 08/14/13 from GI. Patient is currently denying dysphagia or trouble with eating/swallowing. Barium swallow non-diagnostic. Patient has multiple comorbidities, and was recently critically ill. EGD may have risks outweighing the benefits in a patient who is denying dysphagia. He was recently treated with antifungals, and per the history and per the patient, he has not had any dysphasia since that treatment. ?possible underlying candida esophagitis.Overal clinical picture is unclear. I will discuss the case with Dr Shelle Ironein and Dr Bluford Kaufmannh, and make further recommendations from there.  Will continue to follow the patient. continue current diet per speech path recommendations for now.  Electronic Signatures: Ashok Cordia (PA-C)  (Signed 16-Sep-14 16:41)  Authored: Chief Complaint, Brief Assessment, Radiology Results, Assessment/Plan   Last Updated: 16-Sep-14 16:41 by Ashok Cordia (PA-C)

## 2015-03-26 NOTE — Discharge Summary (Signed)
PATIENT NAME:  Derrick Salazar, Derrick Salazar MR#:  [id number] DATE OF BIRTH:  03/27/1931  DATE OF ADMISSION:  08/13/2013 DATE OF DISCHARGE:  08/20/2013  ADDENDUM Please refer to interim discharge summary dictated by Dr. Nemiah CommanderKalisetti on 08/19/2013.  DIAGNOSES: Acute on chronic anemia, requiring 1 unit of packed red blood cells, hypertension, coronary artery disease, hyperlipidemia, chronic atrial fibrillation, sick sinus rhythm, colonic polyps, osteoarthritis, dysphagia, gastroesophageal reflux disease, delirium.   BRIEF HISTORY:  The patient is a 81 year old gentleman with history of dementia, chronic atrial fibrillation, sick sinus syndrome, status post pacemaker, patient of Dr. Dossie Arbourrissman was brought on from Dr. Christell Faithrissman's office secondary to anemia.   His hemoglobin was around 8 before and then here at the hospital was around 6. The patient received 1 unit of packed red blood cells. He was low yield to repeating  colonoscopy and EGD since it was done within the last year. It seems like his anemia is just chronic disease and getting worse. The patient had some urinary retention for which it has been recommended to do I and O catheters and continue the Flomax.   The patient is not getting a Foley because he was at high risk of pulling it and creating more damage, so I and O catheters have been recommended. Followup with urology has been recommended for this.  For more details, please refer to the history and discharge summary of Dr. Nemiah CommanderKalisetti. This is an addendum.   MEDICATIONS AT DISCHARGE: Flomax 0.4 mg once a day, omeprazole 20 mg once a day, furosemide 20 mg once a day, acetaminophen 325 mg 2 tablets every four hours, magnesium oxide 30 mg as needed for constipation, ferrous sulfate 1 to 2 twice daily we can increase the Flomax actually to twice daily.   FOLLOWUP: With Dr. Dossie Arbourrissman. Follow up with urology.   TIME SPENT: I spent about 45 minutes with this discharge today.    ____________________________ Felipa Furnaceoberto Sanchez Gutierrez, MD rsg:sg D: 08/20/2013 12:36:09 ET T: 08/20/2013 13:11:30 ET JOB#: [id number]  cc: Felipa Furnaceoberto Sanchez Gutierrez, MD, <Dictator> Yeraldin Litzenberger Juanda ChanceSANCHEZ GUTIERRE MD ELECTRONICALLY SIGNED 08/28/2013 10:51

## 2015-03-26 NOTE — H&P (Signed)
PATIENT NAME:  Derrick Salazar, CHES[place] MR#:  [id number] DATE OF BIRTH:  04-22-1931  DATE OF ADMISSION:  08/13/2013  REFERRING PHYSICIAN: Richardean Canal, MD  PRIMARY CARE PHYSICIAN: Steele Sizer, MD  CHIEF COMPLAINT: Referred by Dr. Christell Faith office for hemoglobin of 6 range.   HISTORY OF PRESENT ILLNESS: The patient is a 81 year old male who was sent in by Dr. Christell Faith office. The patient is unable to provide any history. The patient apparently has advanced dementia, lives by himself, and there is no family. He has a friend who checks on him, but the friend is out of town. I called Dr. Christell Faith office, as we could not get any information from patient, and the numbers in the chart in regards to the contacts nobody answered. When I spoke to Dr. Christell Faith staff, apparently he had a hemoglobin of 8.3 last December. He was brought in for a social visit, as he had not seen Dr. Dossie Arbour for a while, and he was seen 6 days ago. His hemoglobin was 7.1. He was seen again today and hemoglobin was in the 6's and referred here. Besides this information, we do not have any further information. He came in tachycardic. He has been ordered for a couple of units of blood by the ER physician. His creatinine is higher than usual. Of note, he had a guaiac stool which was negative per Dr. Silverio Lay, the ER physician.   PAST MEDICAL HISTORY: Per chart: Hypertension, hyperlipidemia, CAD, chronic A. fib, history of heart block, status post pacemaker, history of colon polyps, osteoarthritis and GERD.   ALLERGIES: No known drug allergies.   OUTPATIENT MEDICATIONS: When I called Dr. Christell Faith office, apparently he is on omeprazole 20 mg daily, tamsulosin daily and furosemide 20 mg daily. They do not have him on any other medications, but I do not know if he follows with another physician and whether he is on any other medications from them.   FAMILY HISTORY: Per chart: Negative for cardiovascular disease and hypertension from  previous dictation in 2011.   SOCIAL HISTORY: Unable to obtain.  REVIEW OF SYSTEMS: Unable to obtain.   PHYSICAL EXAMINATION: VITAL SIGNS: On arrival, temperature was 98.2, pulse rate 85, respiratory rate 18, blood pressure 104/47. O2 sat noted to be 99% on room air.  GENERAL: The patient is an elderly, thin Caucasian male lying in bed, moving in the bed, not acutely distressed.  HEENT: Normocephalic, atraumatic. Pupils equal and reactive. Anicteric sclerae. There is temporal wasting. Dry mucous membranes.  NECK: Supple. No thyroid tenderness. No cervical lymphadenopathy.  CARDIOVASCULAR: S1, S2, regular rate and rhythm. No significant murmurs appreciated. He has a left upper chest palpable pacemaker.  ABDOMEN: Soft, nontender, nondistended. Positive bowel sounds in all quadrants. No organomegaly.  LUNGS: Clear to auscultation without wheezing, rhonchi or rales.  EXTREMITIES: No significant lower extremity edema.  SKIN: No obvious rashes. NEUROLOGIC: Unable to do a full neuro exam, as he will not follow some directions, but the patient moves all extremities independently. Strength appears to be 5 out of 5. Unable to do sensation. Face appears to be symmetrical. Tongue protrudes midline. Pupils are equal and reactive. Extraocular muscles are intact.  PSYCHIATRIC: He is awake. He knows he is in the hospital. He follows most direction, but he is not oriented to time or person.  LABORATORY, DIAGNOSTIC AND RADIOLOGICAL DATA: Glucose 109, BUN 24, creatinine 2.35 (of note, it was 1.91 in October of last year), sodium 135, potassium 4.5, GFR 25. LFTs within normal  limits. Troponin negative. Hemoglobin 6.2 here (of note, it was 7.1 on the 4th of this month), MCV 60; platelets are 373. INR is 1.1. CT of abdomen and pelvis without contrast to look for retroperitoneal hematoma shows multi-chamber cardiac enlargement and findings consistent with bilateral adrenal adenomas. Chest, AP only: No acute bony  abnormalities of the pelvis and chest x-ray, PA and lateral, showing mild enlargement of the cardiac silhouette without evidence of pulmonary vascular congestion. EKG: Paced at 100.   ASSESSMENT AND PLAN: We have a 81 year old unfortunate male with likely dementia who lives by himself, history of coronary artery disease, history of atrial fibrillation, heart block, status post pacemaker, appears to be also chronic kidney disease at least stage III, who was referred by Dr. Christell Faithrissman's office for anemia. He is unable to provide any history. Anemia appears to be chronic in nature, but currently he has acute on chronic anemia with microcytosis. Although gastrointestinal bleeding is a possibility, he has a negative guaiac, but a negative  guaiac is not a very sensitive test. It is possible that he has a gastrointestinal bleed. He has an EGD and colonoscopy from a couple of years ago without a source. Blood transfusion has been ordered by the ER physician. At this point, I would go ahead and check iron studies as well as retic count, obtain a gastroenterology consult, check a hemoglobin every 8 hours and start him on gentle fluids. I believe he has some dehydration, and his true hemoglobin is likely lower and should come down with IV fluid resuscitation and therefore, I believe he does need the blood transfusion at this point. We would hold Lasix at this point. He does appear to have acute on chronic renal failure, prior stage III, now stage IV. In regards to his history of atrial fibrillation, he is not on any rate-limiting medications apparently. Previously he was on Pradaxa per notes, but he is no longer on it. I would also obtain a palliative care consult as well as care management consult. I do not know if he is able to take care of himself given his circumstances. He has no family per Dr. Christell Faithrissman's office, and he only has a neighbor who checks on him. This might be a difficult issue down the line if we cannot  locate a decision maker for him.   TOTAL TIME SPENT: 55 minutes.   CODE STATUS: Full code for now.    ____________________________ Krystal EatonShayiq Zekiah Caruth, MD sa:jm D: 08/13/2013 15:59:06 ET T: 08/13/2013 16:43:58 ET JOB#: [id number]  cc: Krystal EatonShayiq Alexiah Koroma, MD, <Dictator> Steele SizerMark A. Crissman, MD Krystal EatonSHAYIQ Skiler Olden MD ELECTRONICALLY SIGNED 08/26/2013 14:07
